# Patient Record
Sex: Male | Born: 1980 | Race: White | Hispanic: No | Marital: Married | Smoking: Former smoker
Health system: Southern US, Community
[De-identification: ages and names within clinical notes are randomized; demographics above are authoritative.]

---

## 2011-08-26 ENCOUNTER — Ambulatory Visit (INDEPENDENT_AMBULATORY_CARE_PROVIDER_SITE_OTHER): Payer: BC Managed Care – PPO | Admitting: Family Medicine

## 2011-08-26 VITALS — BP 146/87 | HR 75 | Temp 97.9°F | Resp 18 | Ht 70.25 in | Wt 240.0 lb

## 2011-08-26 DIAGNOSIS — R05 Cough: Secondary | ICD-10-CM

## 2011-08-26 DIAGNOSIS — R059 Cough, unspecified: Secondary | ICD-10-CM

## 2011-08-26 DIAGNOSIS — H6690 Otitis media, unspecified, unspecified ear: Secondary | ICD-10-CM

## 2011-08-26 DIAGNOSIS — H66009 Acute suppurative otitis media without spontaneous rupture of ear drum, unspecified ear: Secondary | ICD-10-CM

## 2011-08-26 DIAGNOSIS — Z0000 Encounter for general adult medical examination without abnormal findings: Secondary | ICD-10-CM

## 2011-08-26 MED ORDER — AMOXICILLIN 875 MG PO TABS: 875.0000 mg | ORAL_TABLET | Freq: Two times a day (BID) | ORAL | Status: AC

## 2011-08-26 MED ORDER — HYDROCODONE-HOMATROPINE 5-1.5 MG/5ML PO SYRP: 5.0000 mL | ORAL_SOLUTION | Freq: Four times a day (QID) | ORAL | Status: AC | PRN

## 2011-08-26 NOTE — Patient Instructions (Signed)
Otitis Media with Effusion Otitis media with effusion is the presence of fluid in the middle ear. This is a common problem that often follows ear infections. It may be present for weeks or longer after the infection. Unlike an acute ear infection, otits media with effusion refers only to fluid behind the ear drum and not infection. Children with repeated ear and sinus infections and allergy problems are the most likely to get otitis media with effusion. CAUSES  The most frequent cause of the fluid buildup is dysfunction of the eustacian tubes. These are the tubes that drain fluid in the ears to the throat. SYMPTOMS   The main symptom of this condition is hearing loss. As a result, you or your child may:   Listen to the TV at a loud volume.   Not respond to questions.   Ask "what" often when spoken to.   There may be a sensation of fullness or pressure but usually not pain.  DIAGNOSIS   Your caregiver will diagnose this condition by examining you or your child's ears.   Your caregiver may test the pressure in you or your child's ear with a tympanometer.   A hearing test may be conducted if the problem persists.   A caregiver will want to re-evaluate the condition periodically to see if it improves.  TREATMENT   Treatment depends on the duration and the effects of the effusion.   Antibiotics, decongestants, nose drops, and cortisone-type drugs may not be helpful.   Children with persistent ear effusions may have delayed language. Children at risk for developmental delays in hearing, learning, and speech may require referral to a specialist earlier than children not at risk.   You or your child's caregiver may suggest a referral to an Ear, Nose, and Throat (ENT) surgeon for treatment. The following may help restore normal hearing:   Drainage of fluid.   Placement of ear tubes (tympanostomy tubes).   Removal of adenoids (adenoidectomy).  HOME CARE INSTRUCTIONS   Avoid second hand  smoke.   Infants who are breast fed are less likely to have this condition.   Avoid feeding infants while laying flat.   Avoid known environmental allergens.   Be sure to see a caregiver or an ENT specialist for follow up.   Avoid people who are sick.  SEEK MEDICAL CARE IF:   Hearing is not better in 3 months.   Hearing is worse.   Ear pain.   Drainage from the ear.   Dizziness.  Document Released: 08/10/2004 Document Revised: 03/15/2011 Document Reviewed: 11/23/2009 [employer] Patient Information 2012 [employer]. 

## 2011-08-26 NOTE — Progress Notes (Signed)
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  Yesterday felt right ear blocking up.  No fever.  Also notes cough, s/t  Obj:  NAD, alert and appropriate  HEENT:  Red throat, bright red ear drum,  Neck supple without adenopathy Chest:  Few ronchi Heart  Normal  A:  Acute otitis media with cough  P:  Cough med and amoxi

## 2013-08-06 ENCOUNTER — Ambulatory Visit: Payer: BC Managed Care – PPO

## 2013-08-06 ENCOUNTER — Ambulatory Visit: Payer: BC Managed Care – PPO | Admitting: Family Medicine

## 2013-08-06 VITALS — BP 124/86 | HR 85 | Temp 98.2°F | Resp 16 | Ht 70.25 in | Wt 235.6 lb

## 2013-08-06 DIAGNOSIS — M6580 Other synovitis and tenosynovitis, unspecified site: Secondary | ICD-10-CM

## 2013-08-06 DIAGNOSIS — M25522 Pain in left elbow: Secondary | ICD-10-CM

## 2013-08-06 DIAGNOSIS — M778 Other enthesopathies, not elsewhere classified: Secondary | ICD-10-CM

## 2013-08-06 DIAGNOSIS — M25529 Pain in unspecified elbow: Secondary | ICD-10-CM

## 2013-08-06 MED ORDER — PREDNISONE 20 MG PO TABS: ORAL_TABLET | ORAL | Status: AC

## 2013-08-06 MED ORDER — HYDROCODONE-ACETAMINOPHEN 5-325 MG PO TABS: 1.0000 | ORAL_TABLET | Freq: Three times a day (TID) | ORAL | Status: AC | PRN

## 2013-08-06 NOTE — Progress Notes (Signed)
Subjective:    Patient ID: Justin Jacobs, male    DOB: 1980-10-07, 33 y.o.   MRN: [id number]  HPI Justin Quintonhis chart was scribed for Kristi Smith-MD, by Ladona Ridgelaylor Day, Scribe. This patient was seen in room 3 and the patient's care was started at 10:24 AM.  HPI Comments: Justin QuintonJoel Jacobs is a 33 y.o. male who presents to the [facility] complaining of left elbow pain, onset few weeks ago.  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 He reports pain increased with overhead movement of his arm or making a fist. He reports occasional paraesthesias and often w/numbness of his forearm during the night (onset a few weeks along w/elbow pain). He denies any swelling. He is right handed.   He denies any previous similar episodes. No previous injuries to his left elbow. He has been taking ibuprofen 800mg  bid and also tried 1 vicodin a few nights ago w/minimal relief.   Patient Active Problem List   Diagnosis Date Noted  . Healthcare maintenance 08/26/2011   History reviewed. No pertinent past surgical history.  No family history on file.  History   Social History  . Marital Status: Married    Spouse Name: N/A    Number of Children: N/A  . Years of Education: N/A   Occupational History  . Not on file.   Social History Main Topics  . Smoking status: Former Smoker    Types: Cigarettes  . Smokeless tobacco: Not on file  . Alcohol Use: Not on file  . Drug Use: Not on file  . Sexual Activity: Not on file   Other Topics Concern  . Not on file   Social History Narrative  . No narrative on file   No Known Allergies  No results found for this or any previous visit.  Review of Systems  Constitutional: Negative for fever and chills.  Respiratory: Negative for cough and shortness of breath.   Cardiovascular: Negative for chest pain.  Gastrointestinal: Negative for abdominal  pain.  Musculoskeletal: Negative for back pain and neck pain.       Left elbow pain  Neurological: Positive for weakness and numbness.       Objective:   Physical Exam  Nursing note and vitals reviewed. Constitutional: He is oriented to person, place, and time. He appears well-developed and well-nourished. No distress.  HENT:  Head: Normocephalic and atraumatic.  Eyes: Conjunctivae are normal. Right eye exhibits no discharge. Left eye exhibits no discharge.  Neck: Normal range of motion.  Cardiovascular: Normal rate.   Pulmonary/Chest: Effort normal. No respiratory distress.  Musculoskeletal: Normal range of motion. He exhibits tenderness. He exhibits no edema.  Full extension but pain reproduced w/flexion No tenderness on olecranon process No swelling of triceps tendon.  No lateral epicondyle tenderness Mild tenderness to palpation medial epicondyle Normal supination and pronation Full ROM bilateral shoulders  Grip is 5/5 Negative tinel's sign  Neurological: He is alert and oriented to person, place, and time.  Skin: Skin is warm and dry.  Psychiatric: He has a normal mood and affect. Thought content normal.   Triage Vitals: BP 124/86  Pulse 85  Temp(Src) 98.2 F (36.8 C) (Oral)  Resp 16  Ht 5' 10.25" (1.784 m)  Wt 235 lb 9.6 oz (106.867 kg)  BMI 33.58 kg/m2  SpO2 98%  DIAGNOSTIC STUDIES: Oxygen Saturation is 98% on room air, normal by my  interpretation.    COORDINATION OF CARE: At 1020 AM Discussed treatment plan with patient which includes left elbow X-ray, recommended rest ice and ibuprofen. Patient agrees.     [facility] reading (PRIMARY) by  Dr. Katrinka Blazing.  L ELBOW: NAD   Assessment & Plan:  .Elbow pain, left - Plan: DG Elbow Complete Left  Left elbow tendonitis - Plan: Ambulatory referral to Orthopedic Surgery  1.  L elbow pain:  New.  Rx for hydrocodone provided to use in evenings; continue Ibuprofen 800mg  tid PRN after completing Prednisone taper. 2.  L elbow  tendonitis: New.  Rx for  Prednisone taper provided; hold Ibuprofen when taking Prednisone; then restart Ibuprofen tid.  Rx for Hydrocodone.  Continue Elbow brace during the day.  Ice elbow bid.  Refer to ortho in case no improvement with current plan.  Home exercises provided to perform daily.  I personally performed the services described in this documentation, which was scribed in my presence.  The recorded information has been reviewed and is accurate.  Nilda Simmer, M.D.  [facility] [address] [place], [place]  [address] [phone] phone [phone] fax

## 2013-08-06 NOTE — Patient Instructions (Signed)
Lateral Epicondylitis (Tennis Elbow) with Rehab Lateral epicondylitis involves inflammation and pain around the outer portion of the elbow. The pain is caused by inflammation of the tendons in the forearm that bring back (extend) the wrist. Lateral epicondylittis is also called tennis elbow, because it is very common in tennis players. However, it may occur in any individual who extends the wrist repetitively. If lateral epicondylitis is left untreated, it may become a chronic problem. SYMPTOMS   Pain, tenderness, and inflammation on the outer (lateral) side of the elbow.  Pain or weakness with gripping activities.  Pain that increases with wrist twisting motions (playing tennis, using a screwdriver, opening a door or a jar).  Pain with lifting objects, including a coffee cup. CAUSES  Lateral epicondylitis is caused by inflammation of the tendons that extend the wrist. Causes of injury may include:  Repetitive stress and strain on the muscles and tendons that extend the wrist.  Sudden change in activity level or intensity.  Incorrect grip in racquet sports.  Incorrect grip size of racquet (often too large).  Incorrect hitting position or technique (usually backhand, leading with the elbow).  Using a racket that is too heavy. RISK INCREASES WITH:  Sports or occupations that require repetitive and/or strenuous forearm and wrist movements (tennis, squash, racquetball, carpentry).  Poor wrist and forearm strength and flexibility.  Failure to warm up properly before activity.  Resuming activity before healing, rehabilitation, and conditioning are complete. PREVENTION   Warm up and stretch properly before activity.  Maintain physical fitness:  Strength, flexibility, and endurance.  Cardiovascular fitness.  Wear and use properly fitted equipment.  Learn and use proper technique and have a coach correct improper technique.  Wear a tennis elbow (counterforce) brace. PROGNOSIS   The course of this condition depends on the degree of the injury. If treated properly, acute cases (symptoms lasting less than 4 weeks) are often resolved in 2 to 6 weeks. Chronic (longer lasting cases) often resolve in 3 to 6 months, but may require physical therapy. RELATED COMPLICATIONS   Frequently recurring symptoms, resulting in a chronic problem. Properly treating the problem the first time decreases frequency of recurrence.  Chronic inflammation, scarring tendon degeneration, and partial tendon tear, requiring surgery.  Delayed healing or resolution of symptoms. TREATMENT  Treatment first involves the use of ice and medicine, to reduce pain and inflammation. Strengthening and stretching exercises may help reduce discomfort, if performed regularly. These exercises may be performed at home, if the condition is an acute injury. Chronic cases may require a referral to a physical therapist for evaluation and treatment. Your caregiver may advise a corticosteroid injection, to help reduce inflammation. Rarely, surgery is needed. MEDICATION  If pain medicine is needed, nonsteroidal anti-inflammatory medicines (aspirin and ibuprofen), or other minor pain relievers (acetaminophen), are often advised.  Do not take pain medicine for 7 days before surgery.  Prescription pain relievers may be given, if your caregiver thinks they are needed. Use only as directed and only as much as you need.  Corticosteroid injections may be recommended. These injections should be reserved only for the most severe cases, because they can only be given a certain number of times. HEAT AND COLD  Cold treatment (icing) should be applied for 10 to 15 minutes every 2 to 3 hours for inflammation and pain, and immediately after activity that aggravates your symptoms. Use ice packs or an ice massage.  Heat treatment may be used before performing stretching and strengthening activities prescribed by your   caregiver, physical  therapist, or athletic trainer. Use a heat pack or a warm water soak. SEEK MEDICAL CARE IF: Symptoms get worse or do not improve in 2 weeks, despite treatment. EXERCISES  RANGE OF MOTION (ROM) AND STRETCHING EXERCISES - Epicondylitis, Lateral (Tennis Elbow) These exercises may help you when beginning to rehabilitate your injury. Your symptoms may go away with or without further involvement from your physician, physical therapist or athletic trainer. While completing these exercises, remember:   Restoring tissue flexibility helps normal motion to return to the joints. This allows healthier, less painful movement and activity.  An effective stretch should be held for at least 30 seconds.  A stretch should never be painful. You should only feel a gentle lengthening or release in the stretched tissue. RANGE OF MOTION  Wrist Flexion, Active-Assisted  Extend your right / left elbow with your fingers pointing down.*  Gently pull the back of your hand towards you, until you feel a gentle stretch on the top of your forearm.  Hold this position for __________ seconds. Repeat __________ times. Complete this exercise __________ times per day.  *If directed by your physician, physical therapist or athletic trainer, complete this stretch with your elbow bent, rather than extended. RANGE OF MOTION  Wrist Extension, Active-Assisted  Extend your right / left elbow and turn your palm upwards.*  Gently pull your palm and fingertips back, so your wrist extends and your fingers point more toward the ground.  You should feel a gentle stretch on the inside of your forearm.  Hold this position for __________ seconds. Repeat __________ times. Complete this exercise __________ times per day. *If directed by your physician, physical therapist or athletic trainer, complete this stretch with your elbow bent, rather than extended. STRETCH - Wrist Flexion  Place the back of your right / left hand on a tabletop,  leaving your elbow slightly bent. Your fingers should point away from your body.  Gently press the back of your hand down onto the table by straightening your elbow. You should feel a stretch on the top of your forearm.  Hold this position for __________ seconds. Repeat __________ times. Complete this stretch __________ times per day.  STRETCH  Wrist Extension   Place your right / left fingertips on a tabletop, leaving your elbow slightly bent. Your fingers should point backwards.  Gently press your fingers and palm down onto the table by straightening your elbow. You should feel a stretch on the inside of your forearm.  Hold this position for __________ seconds. Repeat __________ times. Complete this stretch __________ times per day.  STRENGTHENING EXERCISES - Epicondylitis, Lateral (Tennis Elbow) These exercises may help you when beginning to rehabilitate your injury. They may resolve your symptoms with or without further involvement from your physician, physical therapist or athletic trainer. While completing these exercises, remember:   Muscles can gain both the endurance and the strength needed for everyday activities through controlled exercises.  Complete these exercises as instructed by your physician, physical therapist or athletic trainer. Increase the resistance and repetitions only as guided.  You may experience muscle soreness or fatigue, but the pain or discomfort you are trying to eliminate should never worsen during these exercises. If this pain does get worse, stop and make sure you are following the directions exactly. If the pain is still present after adjustments, discontinue the exercise until you can discuss the trouble with your caregiver. STRENGTH Wrist Flexors  Sit with your right / left forearm palm-up and   fully supported on a table or countertop. Your elbow should be resting below the height of your shoulder. Allow your wrist to extend over the edge of the  surface.  Loosely holding a __________ weight, or a piece of rubber exercise band or tubing, slowly curl your hand up toward your forearm.  Hold this position for __________ seconds. Slowly lower the wrist back to the starting position in a controlled manner. Repeat __________ times. Complete this exercise __________ times per day.  STRENGTH  Wrist Extensors  Sit with your right / left forearm palm-down and fully supported on a table or countertop. Your elbow should be resting below the height of your shoulder. Allow your wrist to extend over the edge of the surface.  Loosely holding a __________ weight, or a piece of rubber exercise band or tubing, slowly curl your hand up toward your forearm.  Hold this position for __________ seconds. Slowly lower the wrist back to the starting position in a controlled manner. Repeat __________ times. Complete this exercise __________ times per day.  STRENGTH - Ulnar Deviators  Stand with a ____________________ weight in your right / left hand, or sit while holding a rubber exercise band or tubing, with your healthy arm supported on a table or countertop.  Move your wrist, so that your pinkie travels toward your forearm and your thumb moves away from your forearm.  Hold this position for __________ seconds and then slowly lower the wrist back to the starting position. Repeat __________ times. Complete this exercise __________ times per day STRENGTH - Radial Deviators  Stand with a ____________________ weight in your right / left hand, or sit while holding a rubber exercise band or tubing, with your injured arm supported on a table or countertop.  Raise your hand upward in front of you or pull up on the rubber tubing.  Hold this position for __________ seconds and then slowly lower the wrist back to the starting position. Repeat __________ times. Complete this exercise __________ times per day. STRENGTH  Forearm Supinators   Sit with your right /  left forearm supported on a table, keeping your elbow below shoulder height. Rest your hand over the edge, palm down.  Gently grip a hammer or a soup ladle.  Without moving your elbow, slowly turn your palm and hand upward to a "thumbs-up" position.  Hold this position for __________ seconds. Slowly return to the starting position. Repeat __________ times. Complete this exercise __________ times per day.  STRENGTH  Forearm Pronators   Sit with your right / left forearm supported on a table, keeping your elbow below shoulder height. Rest your hand over the edge, palm up.  Gently grip a hammer or a soup ladle.  Without moving your elbow, slowly turn your palm and hand upward to a "thumbs-up" position.  Hold this position for __________ seconds. Slowly return to the starting position. Repeat __________ times. Complete this exercise __________ times per day.  STRENGTH - Grip  Grasp a tennis ball, a dense sponge, or a large, rolled sock in your hand.  Squeeze as hard as you can, without increasing any pain.  Hold this position for __________ seconds. Release your grip slowly. Repeat __________ times. Complete this exercise __________ times per day.  STRENGTH - Elbow Extensors, Isometric  Stand or sit upright, on a firm surface. Place your right / left arm so that your palm faces your stomach, and it is at the height of your waist.  Place your opposite hand on   the underside of your forearm. Gently push up as your right / left arm resists. Push as hard as you can with both arms, without causing any pain or movement at your right / left elbow. Hold this stationary position for __________ seconds. Gradually release the tension in both arms. Allow your muscles to relax completely before repeating. Document Released: 07/03/2005 Document Revised: 09/25/2011 Document Reviewed: 10/15/2008 [employer] Patient Information 2014 [employer].  

## 2015-06-01 IMAGING — CR DG ELBOW COMPLETE 3+V*L*
3 series · 3 of 3 positions shown · non-contrast
Comparison: None.

CLINICAL DATA: Pain.

EXAM:
LEFT ELBOW - COMPLETE 3+ VIEW

[AP]
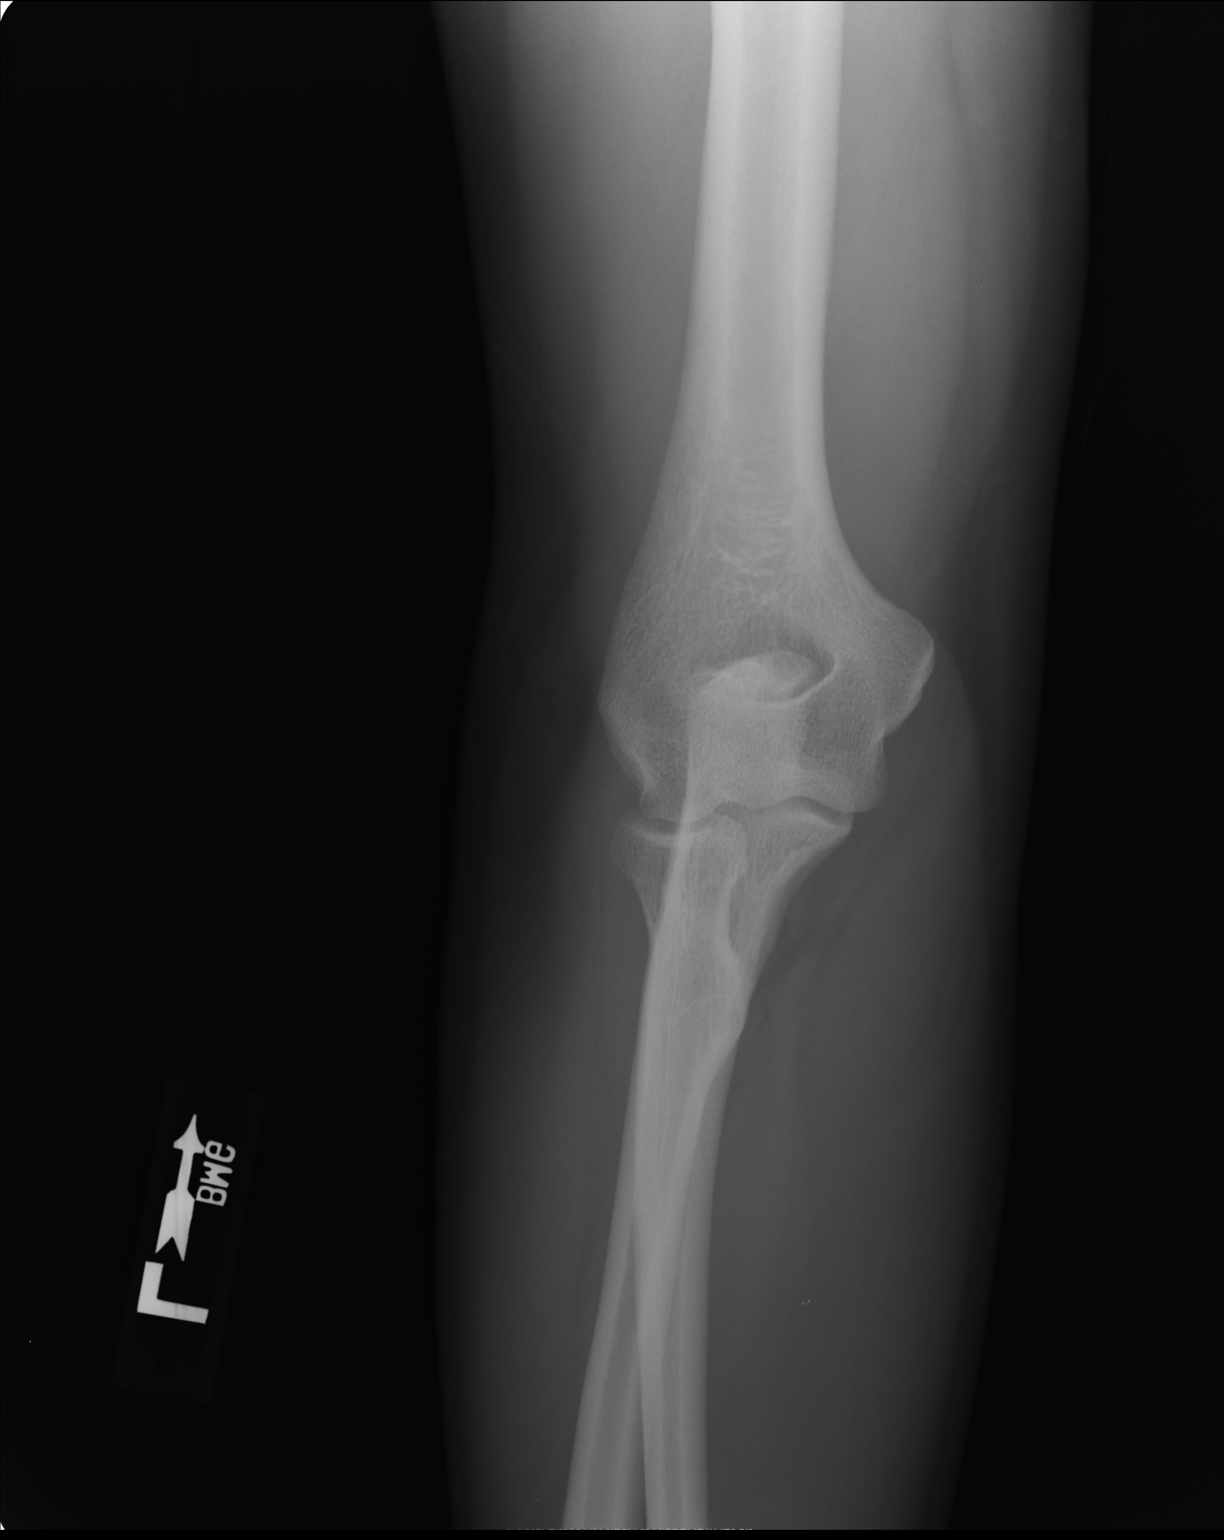

[ap obl ext rot]
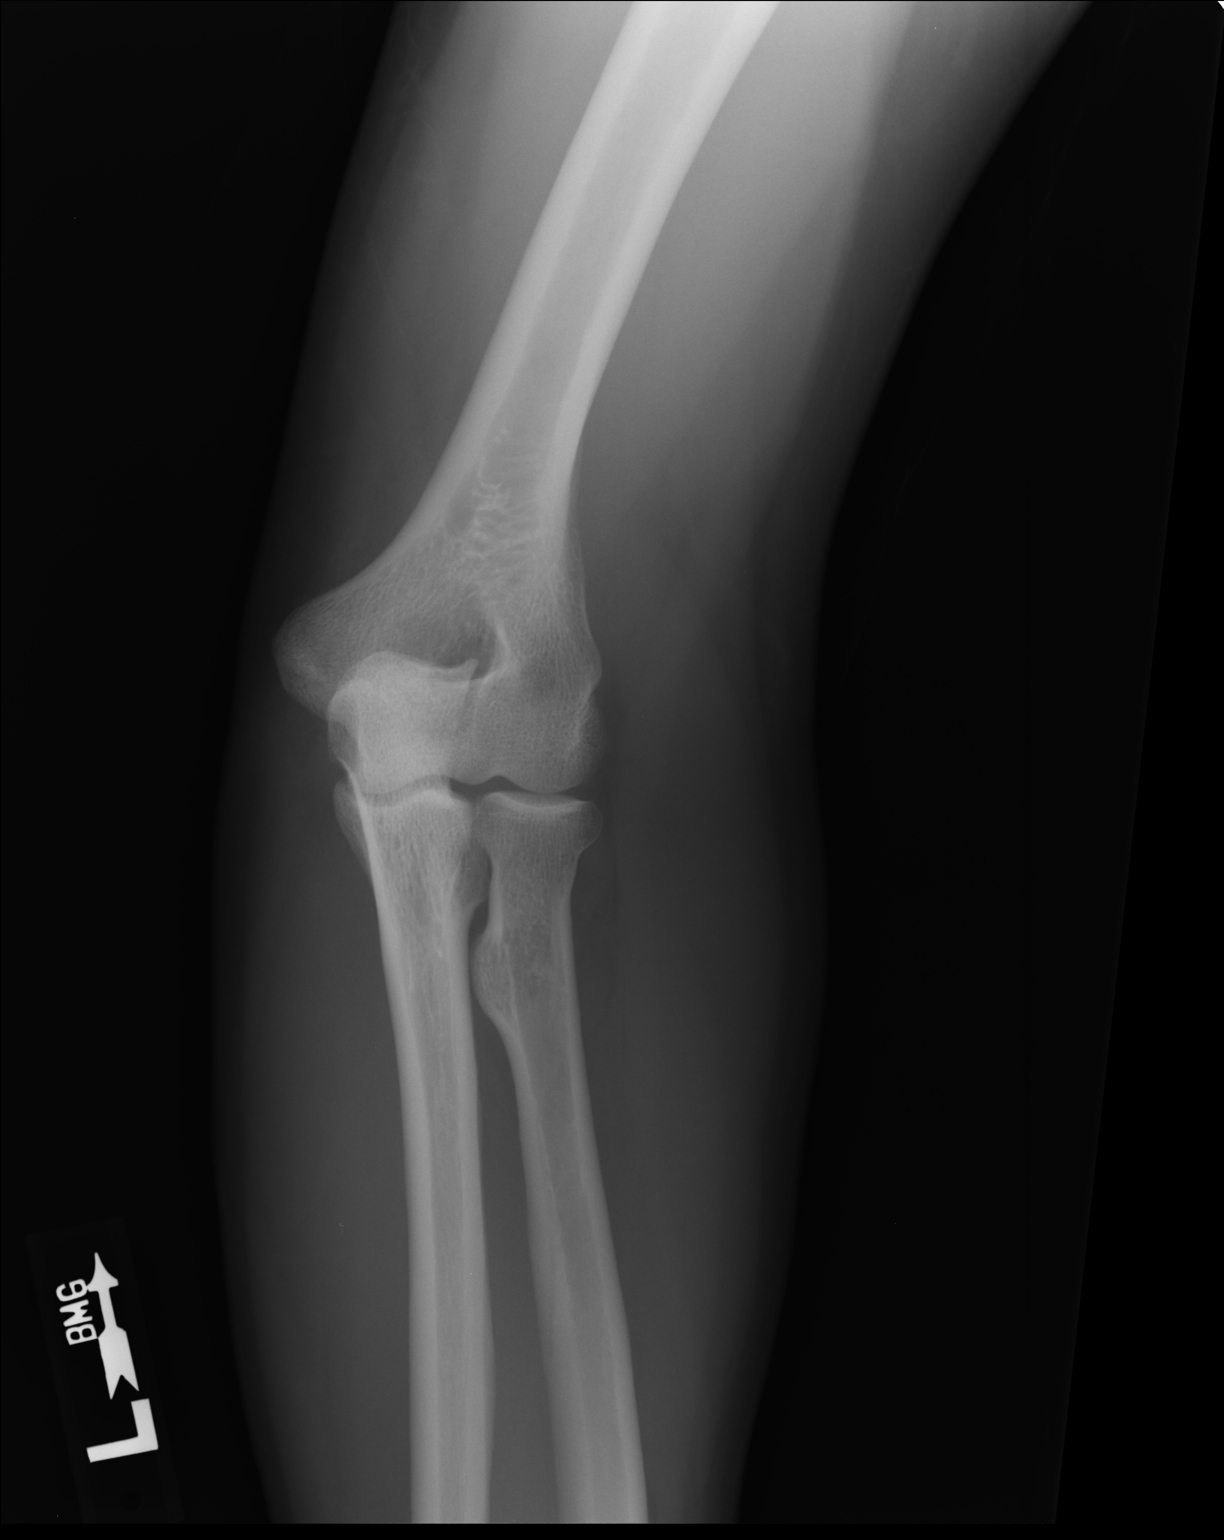

[lateral]
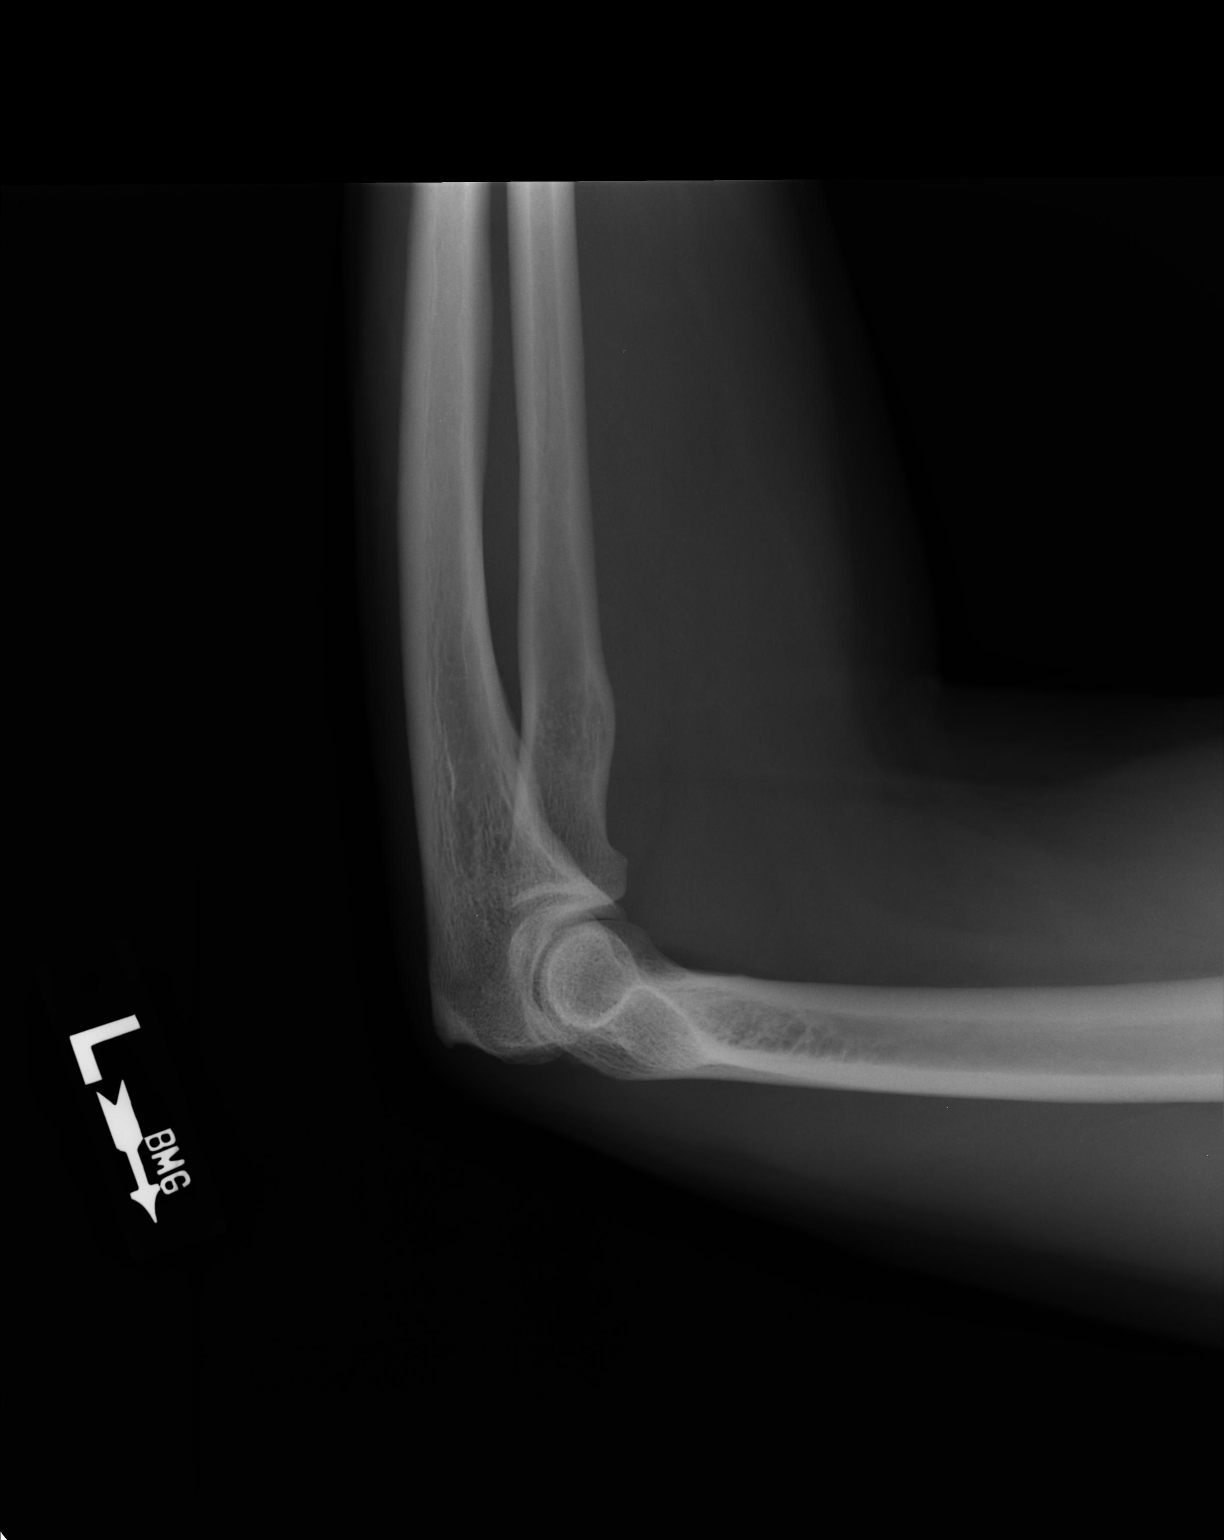

[3 of 3 positions shown; findings below may reference images not displayed]

FINDINGS: There is no evidence of fracture, dislocation, or joint effusion.
There is no evidence of arthropathy or other focal bone abnormality.
Soft tissues are unremarkable.
IMPRESSION: Negative.
